# Patient Record
Sex: Female | Born: 1955 | Race: White | Hispanic: No | Marital: Married | Smoking: Never smoker
Health system: Southern US, Community
[De-identification: ages and names within clinical notes are randomized; demographics above are authoritative.]

## PROBLEM LIST (undated history)

## (undated) HISTORY — PX: ABDOMINAL HYSTERECTOMY: SHX81

---

## 2005-06-10 ENCOUNTER — Encounter: Admission: RE | Admit: 2005-06-10 | Discharge: 2005-06-10 | Payer: Self-pay | Admitting: Preventative Medicine

## 2006-02-07 ENCOUNTER — Emergency Department (HOSPITAL_COMMUNITY): Admission: EM | Admit: 2006-02-07 | Discharge: 2006-02-07 | Payer: Self-pay | Admitting: Emergency Medicine

## 2009-03-29 ENCOUNTER — Ambulatory Visit (HOSPITAL_COMMUNITY): Admission: RE | Admit: 2009-03-29 | Discharge: 2009-03-29 | Payer: Self-pay | Admitting: Obstetrics and Gynecology

## 2009-03-29 ENCOUNTER — Encounter: Payer: Self-pay | Admitting: Gastroenterology

## 2010-07-03 ENCOUNTER — Encounter: Admission: RE | Admit: 2010-07-03 | Discharge: 2010-07-03 | Payer: Self-pay | Admitting: Orthopedic Surgery

## 2015-09-28 ENCOUNTER — Other Ambulatory Visit: Payer: Self-pay | Admitting: *Deleted

## 2015-09-28 ENCOUNTER — Ambulatory Visit (INDEPENDENT_AMBULATORY_CARE_PROVIDER_SITE_OTHER): Payer: 59 | Admitting: Internal Medicine

## 2015-09-28 ENCOUNTER — Encounter: Payer: Self-pay | Admitting: Internal Medicine

## 2015-09-28 VITALS — BP 114/62 | HR 87 | Temp 98.2°F | Resp 12 | Wt 120.8 lb

## 2015-09-28 DIAGNOSIS — E039 Hypothyroidism, unspecified: Secondary | ICD-10-CM

## 2015-09-28 MED ORDER — NONFORMULARY OR COMPOUNDED ITEM: Status: DC

## 2015-09-28 NOTE — Patient Instructions (Addendum)
Please stop Biotin now and go for labs on Friday.  Please decrease the Thyroid USP to alternating 120 mg with 75 mg every other day.  Please try to join MyChart for easier communication.  Please come back for a follow-up appointment in 4 months.

## 2015-09-28 NOTE — Progress Notes (Addendum)
Patient ID: Claudia Owens, female   DOB: June 10, 1956, 60 y.o.   MRN: [id number]   HPI  Claudia Owens is a 60 y.o.-year-old female, referred by , Julio Sicks, NP , for management of uncontrolled hypothyroidism.  Pt. has been dx with hypothyroidism in 2012; she was on LT4 >> started to not feel well on this anymore (hair falling, irritability), added Cytomel >> did not like this >> now on Thyroid USP 75 >> 120 mg capsule (prev. Rx'ed by Dr Dorothyann Peng). This is a thyroid extract, containing both T3 and T4, dose equivalent to ~200 g of LT4.  She takes thyroid hh: - fasting - with water - separated by >30 min from b'fast  - no calcium, iron, PPIs - + B vitamins in am  I reviewed pt's thyroid tests - per records from [facility]: 06/23/2015: TSH 0.017, total T4 5.5 (4.5-12), free T4 0.84 (0.8-1.8), vitamin D 28 03/29/2015: Thyroglobulin antibodies <1 (0-0.9), TPO antibodies 8 (0-34) 03/08/2015: TSH 0.092, free T4 (direct) 0.78 (0.82-1.77), free T3 3.2 (2.0-4.4), B12 406, vitamin D 29.7 09/07/2014: TSH 0.049,  Direct free T4 0.99 (0.82-1.77), free T3 3.6 (2.0-4.4) 09/22/2009: TSH 1.67, free T4 (direct) 1.15 (0.82-1.77), vitamin D 30  Pt describes: - + weight gain - + palpitations - no fatigue - no cold intolerance - no depression - no constipation - no dry skin - no hair loss  She is on Hair Skin and Nails - takes this in am (took it this am)  Pt denies feeling nodules in neck, hoarseness, dysphagia/odynophagia, SOB with lying down.  She has + FH of thyroid disorders in: sister (hypothyroidism). No FH of thyroid cancer.  No h/o radiation tx to head or neck. No recent use of iodine supplements.  I reviewed her chart and she also has a history of vit D def.  She is on Estrogen pellets, and Pg at night. Exercises by running/walking 5x a week.  ROS: Constitutional: + weight gain, + fatigue, no subjective hyperthermia/hypothermia Eyes: no blurry vision, no xerophthalmia ENT: no sore  throat, no nodules palpated in throat, no dysphagia/odynophagia, no hoarseness Cardiovascular: no CP/SOB/+ palpitations/no leg swelling Respiratory: no cough/SOB Gastrointestinal: no N/V/D/C Musculoskeletal: no muscle/joint aches Skin: no rashes Neurological: no tremors/numbness/tingling/dizziness Psychiatric: no depression/anxiety  PMH: - see HPI - + basal cell CA  History reviewed. No pertinent past surgical history.   Social History   Social History  . Marital Status: Married    Spouse Name: N/A  . Number of Children: 4   Occupational History  . homemaker   Social History Main Topics  . Smoking status: Never Smoker   . Smokeless tobacco: Not on file  . Alcohol Use: 0.0 oz/week    0 Standard drinks or equivalent per week  . Drug Use: No   Current Outpatient Rx  Name  Route  Sig  Dispense  Refill  . traMADol (ULTRAM) 50 MG tablet   Oral   Take by mouth.          Allergies  Allergen Reactions  . Latex Rash    Family History  Problem Relation Age of Onset  . Hyperlipidemia Mother   . Hypertension Mother   . Cancer Father   . Thyroid disease Sister   . Heart disease Brother   . Cancer Brother    PE: BP 114/62 mmHg  Pulse 87  Temp(Src) 98.2 F (36.8 C) (Oral)  Resp 12  Wt 120 lb 12.8 oz (54.795 kg)  SpO2 96%  There is no height on file to calculate BMI.  Wt Readings from Last 3 Encounters:  09/28/15 120 lb 12.8 oz (54.795 kg)   Constitutional: overweight, in NAD, looking younger than stated age Eyes: PERRLA, EOMI, no exophthalmos, mild stare ENT: moist mucous membranes, no thyromegaly, no cervical lymphadenopathy Cardiovascular: RRR, No MRG Respiratory: CTA B Gastrointestinal: abdomen soft, NT, ND, BS+ Musculoskeletal: no deformities, strength intact in all 4 Skin: moist, warm, no rashes Neurological: no tremor with outstretched hands, DTR normal in all 4  ASSESSMENT: 1. Hypothyroidism - TPO Abs and ATA negative >> no sign of Hashimoto's  thyroiditis  PLAN:  1. Patient with long-standing hypothyroidism, with long-time suppression of her TSH, on a large dose of compounded desiccated thyroid formulation - [facility] [place] (the dosing is very similar to Armour thyroid, the equivalent of 200 g levothyroxine daily). I am not sure how to dose adjustments were done in the past, but reviewing her chart her TSH has been very suppressed at least for the last year. She appears euthyroid, but maybe a little restless and she does complain of palpitations and fatigue. She does not appear to have a goiter, thyroid nodules, or neck compression symptoms. - we discussed at length about the effects of long-term thyroid suppression on her organism including tachycardia/arrhythmia/palpitations, but also increased fatigue, increased metabolism of other medicines (she already noticed this with her estrogen replacement), increased risk of clots including stroke and heart attacks, increased risk of diabetes and hypertension, and also osteoporosis. She is in agreement of trying to bring her TSH to normal, but will try to do this slowly, to allow her body to adjust this. - We discussed about correct intake of thyroid hormones, fasting, with water, separated by at least 30 minutes from breakfast, and separated by more than 4 hours from calcium, iron, multivitamins, acid reflux medications (PPIs). She is taking it correctly. - I would have liked to check her thyroid tests today, however, she took biotin this morning. I advised her to have these labs drawn in 2 days. She will be out of town for a few days after this, therefore, for now, I advised her to decrease the dose to 120 mg alternating with 75 mg of compounded thyroid formulation (since she has this other dose at home) every other day. After I get the results back in 2 days, I will contact her to adjust the dose further. - We will check the TSH, free T4, free T3 in 2 days -  If these are abnormal, she will need to return in 5-6 weeks for repeat labs - If these are normal, I will see her back in 4 months  Component     Latest Ref Rng 09/30/2015  TSH     0.450 - 4.500 uIU/mL 0.021 (L)  Free T4     0.82 - 1.77 ng/dL 5.28  T3, Free     2.0 - 4.4 pg/mL 5.9 (H)   Will decrease dose to 90 mg Thyroid USP daily and have her come back for labs in 5-6 weeks.

## 2015-10-01 LAB — T4, FREE: Free T4: 0.96 ng/dL (ref 0.82–1.77)

## 2015-10-01 LAB — T3, FREE: T3 FREE: 5.9 pg/mL — AB (ref 2.0–4.4)

## 2015-10-01 LAB — TSH: TSH: 0.021 u[IU]/mL — ABNORMAL LOW (ref 0.450–4.500)

## 2015-10-03 ENCOUNTER — Ambulatory Visit: Payer: Self-pay | Admitting: Internal Medicine

## 2015-10-04 ENCOUNTER — Encounter: Payer: Self-pay | Admitting: Internal Medicine

## 2015-10-04 MED ORDER — NONFORMULARY OR COMPOUNDED ITEM: Status: DC

## 2015-10-04 NOTE — Addendum Note (Signed)
Addended by: Carlus Pavlov on: 10/04/2015 12:55 PM   Modules accepted: Orders

## 2015-10-31 ENCOUNTER — Telehealth: Payer: Self-pay | Admitting: Internal Medicine

## 2015-10-31 NOTE — Telephone Encounter (Signed)
Returned pt's call. Pt is to have labs done in 5-6 weeks from her last lab appt. Pt to call back and make appt or let us know if she will go to [facility] in [place]. Pt will call with fax # if she decides to go there.

## 2015-10-31 NOTE — Telephone Encounter (Signed)
Pt said she got a message about scheduling appointments and isn't sure when or what needed to be scheduled and wanted to double check with you to be sure.

## 2015-11-14 ENCOUNTER — Telehealth: Payer: Self-pay | Admitting: Internal Medicine

## 2015-11-14 NOTE — Telephone Encounter (Signed)
Pt called back and said she does want to go ahead today and do the labs at [facility] her and Carollee HerterShannon discussed earlier last week and to go ahead and send the orders ASAP Fax # [phone]

## 2015-11-14 NOTE — Telephone Encounter (Signed)
Order faxed to [facility] as instructed by patient.

## 2015-11-15 ENCOUNTER — Other Ambulatory Visit: Payer: Self-pay | Admitting: Internal Medicine

## 2015-11-16 LAB — TSH: TSH: 0.678 u[IU]/mL (ref 0.450–4.500)

## 2015-11-16 LAB — T4, FREE: Free T4: 0.84 ng/dL (ref 0.82–1.77)

## 2015-11-16 LAB — T3, FREE: T3, Free: 3.1 pg/mL (ref 2.0–4.4)

## 2015-12-26 ENCOUNTER — Telehealth: Payer: Self-pay | Admitting: Internal Medicine

## 2015-12-26 MED ORDER — NONFORMULARY OR COMPOUNDED ITEM: Status: DC

## 2015-12-26 NOTE — Telephone Encounter (Signed)
Patient need a refill of medication Thyroid UPS 90 mg send to  [facility] - [place],  - [address] [phone] (Phone) [phone] (Fax)

## 2015-12-27 ENCOUNTER — Telehealth: Payer: Self-pay | Admitting: Internal Medicine

## 2015-12-27 NOTE — Telephone Encounter (Signed)
Refill sent this morning 

## 2015-12-27 NOTE — Telephone Encounter (Signed)
Pt states the pharmacy has not received the thyroid rx please resend

## 2016-01-26 ENCOUNTER — Ambulatory Visit: Payer: Self-pay | Admitting: Internal Medicine

## 2016-04-05 ENCOUNTER — Telehealth: Payer: Self-pay | Admitting: Internal Medicine

## 2016-04-05 ENCOUNTER — Other Ambulatory Visit: Payer: Self-pay

## 2016-04-05 DIAGNOSIS — E039 Hypothyroidism, unspecified: Secondary | ICD-10-CM

## 2016-04-05 NOTE — Telephone Encounter (Signed)
Yes, no problem: Can you please order a TSH, free T4, free T3?

## 2016-04-05 NOTE — Telephone Encounter (Signed)
Called patient back, she would like her lab orders sent to [facility] in [place], I advised I would fax over the orders and she could go get them drawn. No other questions were asked.

## 2016-04-05 NOTE — Telephone Encounter (Signed)
Patient stated that she has been feeling bad l and would like to know if she can get labs before she see you, in September? send to [facility] in [place] phone # [phone].

## 2016-04-09 ENCOUNTER — Ambulatory Visit: Payer: Self-pay | Admitting: Internal Medicine

## 2016-04-25 NOTE — Telephone Encounter (Signed)
[facility] called, stated  Need a order for labs fax [phone]

## 2016-04-25 NOTE — Telephone Encounter (Signed)
Faxed over to appropriate number for [facility] with lab orders.

## 2016-04-27 ENCOUNTER — Other Ambulatory Visit: Payer: Self-pay | Admitting: Internal Medicine

## 2016-04-28 LAB — T3: T3, Total: 138 ng/dL (ref 71–180)

## 2016-04-28 LAB — T4, FREE: Free T4: 1.03 ng/dL (ref 0.82–1.77)

## 2016-04-28 LAB — TSH: TSH: 2.23 u[IU]/mL (ref 0.450–4.500)

## 2016-05-01 ENCOUNTER — Ambulatory Visit (INDEPENDENT_AMBULATORY_CARE_PROVIDER_SITE_OTHER): Payer: 59 | Admitting: Internal Medicine

## 2016-05-01 ENCOUNTER — Other Ambulatory Visit: Payer: Self-pay

## 2016-05-01 ENCOUNTER — Encounter: Payer: Self-pay | Admitting: Internal Medicine

## 2016-05-01 VITALS — BP 104/72 | HR 76 | Wt 118.0 lb

## 2016-05-01 DIAGNOSIS — E039 Hypothyroidism, unspecified: Secondary | ICD-10-CM | POA: Diagnosis not present

## 2016-05-01 MED ORDER — NONFORMULARY OR COMPOUNDED ITEM: 3 refills | Status: DC

## 2016-05-01 NOTE — Patient Instructions (Addendum)
Please return in 1 year, but in 6 months for labs.  Please continue: - Thyroid USP to 90 mg daily  Take the thyroid hormone every day, with water, at least 30 minutes before breakfast, separated by at least 4 hours from: - acid reflux medications - calcium - iron - multivitamins

## 2016-05-01 NOTE — Progress Notes (Signed)
Patient ID: Claudia Owens, female   DOB: 1956/06/24, 60 y.o.   MRN: [id number]   HPI  Claudia Owens is a 60 y.o.-year-old female, retirning for f/u for uncontrolled hypothyroidism. Last visit 7 mo ago.  Reviewed hx: Pt. has been dx with hypothyroidism in 2012; she was on LT4 >> started to not feel well on this anymore (hair falling, irritability), added Cytomel >> did not like this >> now on Thyroid USP 75 >> 120 mg capsule (prev. Rx'ed by Dr Dorothyann Pengobyn Sanders). This is a thyroid extract, containing both T3 and T4, dose equivalent to ~200 g of LT4.  We have decreased the dose of Thyroid USP to 90 mg daily >> TFTs normalized.  She takes thyroid hh: - fasting - with water - separated by >30 min from b'fast  - no calcium, iron, PPIs - + B vitamins in am  Latest TFTs: Lab Results  Component Value Date   TSH 2.230 04/27/2016   TSH 0.678 11/15/2015   TSH 0.021 (L) 09/30/2015   FREET4 1.03 04/27/2016   FREET4 0.84 11/15/2015   FREET4 0.96 09/30/2015   I reviewed pt's thyroid tests - per records from [facility]: 06/23/2015: TSH 0.017, total T4 5.5 (4.5-12), free T4 0.84 (0.8-1.8), vitamin D 28 03/29/2015: Thyroglobulin antibodies <1 (0-0.9), TPO antibodies 8 (0-34) 03/08/2015: TSH 0.092, free T4 (direct) 0.78 (0.82-1.77), free T3 3.2 (2.0-4.4), B12 406, vitamin D 29.7 09/07/2014: TSH 0.049,  Direct free T4 0.99 (0.82-1.77), free T3 3.6 (2.0-4.4) 09/22/2009: TSH 1.67, free T4 (direct) 1.15 (0.82-1.77), vitamin D 30  Pt describes: - no weight gain - no palpitations - + fatigue - no cold intolerance - no depression - no constipation - no dry skin - no hair loss  She is on Hair Skin and Nails - takes this in am (took it this am)  Pt denies feeling nodules in neck, hoarseness, dysphagia/odynophagia, SOB with lying down.  She has + FH of thyroid disorders in: sister (hypothyroidism). No FH of thyroid cancer.  No h/o radiation tx to head or neck. No recent use of iodine supplements.  I  reviewed her chart and she also has a history of vit D def.  She is on HRT: Estrogen + Testosterone pellets, and Pg at night. Exercises by running/walking 5x a week.  ROS: Constitutional: no weight gain, + fatigue, no subjective hyperthermia/hypothermia Eyes: no blurry vision, no xerophthalmia ENT: no sore throat, no nodules palpated in throat, no dysphagia/odynophagia, no hoarseness Cardiovascular: no CP/SOB/palpitations/no leg swelling Respiratory: no cough/SOB Gastrointestinal: no N/V/D/C Musculoskeletal: no muscle/joint aches Skin: no rashes Neurological: no tremors/numbness/tingling/dizziness  I reviewed pt's medications, allergies, PMH, social hx, family hx, and changes were documented in the history of present illness. Otherwise, unchanged from my initial visit note.  PMH: - see HPI - + basal cell CA  No past surgical history on file.   Social History   Social History  . Marital Status: Married    Spouse Name: N/A  . Number of Children: 4   Occupational History  . homemaker   Social History Main Topics  . Smoking status: Never Smoker   . Smokeless tobacco: Not on file  . Alcohol Use: 0.0 oz/week    0 Standard drinks or equivalent per week  . Drug Use: No   Current Outpatient Prescriptions on File Prior to Visit  Medication Sig Dispense Refill  . NONFORMULARY OR COMPOUNDED ITEM Thyroid USP 90 mg daily - compounded. 45 each 2  . traMADol (ULTRAM) 50 MG  tablet Take by mouth.     No current facility-administered medications on file prior to visit.     Allergies  Allergen Reactions  . Amoxicillin Other (See Comments)    Intestinal Bleeding  . Propoxyphene Nausea And Vomiting  . Latex Rash    Family History  Problem Relation Age of Onset  . Hyperlipidemia Mother   . Hypertension Mother   . Cancer Father   . Thyroid disease Sister   . Heart disease Brother   . Cancer Brother    PE: BP 104/72 (BP Location: Left Arm, Patient Position: Sitting)   Pulse 76    Wt 118 lb (53.5 kg)   SpO2 97%   There is no height or weight on file to calculate BMI.  Wt Readings from Last 3 Encounters:  05/01/16 118 lb (53.5 kg)  09/28/15 120 lb 12.8 oz (54.8 kg)   Constitutional: overweight, in NAD, looking younger than stated age Eyes: PERRLA, EOMI, no exophthalmos, mild stare ENT: moist mucous membranes, no thyromegaly, + B cervical lymphadenopathy Cardiovascular: RRR, No MRG Respiratory: CTA B Gastrointestinal: abdomen soft, NT, ND, BS+ Musculoskeletal: no deformities, strength intact in all 4 Skin: moist, warm, no rashes Neurological: no tremor with outstretched hands, DTR normal in all 4  ASSESSMENT: 1. Hypothyroidism - TPO Abs and ATA negative >> no sign of Hashimoto's thyroiditis  PLAN:  1. Patient with long-standing hypothyroidism, with long-time suppression of her TSH, on a large dose of compounded desiccated thyroid formulation >> we decreased the dose of Thyroid USP >> TFTs normalized. Last tests were from 04/27/2016 >> nomal - she [facility] [place] >> will fax a new Rx. - We discussed about correct intake of thyroid hormones, fasting, with water, separated by at least 30 minutes from breakfast, and separated by more than 4 hours from calcium, iron, multivitamins, acid reflux medications (PPIs). She is taking it correctly. - will recheck her labs in 6 months - prefers to have these drawn locally - I will see her back in 1 year  No visits with results within 3 Day(s) from this visit.  Latest known visit with results is:  Orders Only on 04/27/2016  Component Date Value Ref Range Status  . Free T4 04/28/2016 1.03  0.82 - 1.77 ng/dL Final  . TSH 46/96/2952 2.230  0.450 - 4.500 uIU/mL Final  . T3, Total 04/28/2016 138  71 - 180 ng/dL Final   Normal TFTs. We can continue with the current dose of thyroid USP.  Carlus Pavlov, MD PhD [facility] Endocrinology

## 2017-02-06 ENCOUNTER — Telehealth: Payer: Self-pay | Admitting: Internal Medicine

## 2017-02-06 ENCOUNTER — Encounter: Payer: Self-pay | Admitting: Internal Medicine

## 2017-02-06 NOTE — Telephone Encounter (Signed)
attempted to contact pt, appt needs to be moved, no ans no vm set up, mailed letter

## 2017-03-19 ENCOUNTER — Ambulatory Visit: Payer: 59 | Admitting: Internal Medicine

## 2017-04-30 ENCOUNTER — Other Ambulatory Visit: Payer: Self-pay

## 2017-04-30 ENCOUNTER — Telehealth: Payer: Self-pay | Admitting: Internal Medicine

## 2017-04-30 MED ORDER — NONFORMULARY OR COMPOUNDED ITEM: 3 refills | Status: AC

## 2017-04-30 NOTE — Telephone Encounter (Signed)
Routing to you °

## 2017-04-30 NOTE — Telephone Encounter (Signed)
MEDICATION: NONFORMULARY OR COMPOUNDED ITEM  PHARMACY:  [facility] - [place] - [address] [place] [address] Phone: [phone] Fax: [phone]     IS THIS A 90 DAY SUPPLY : no  IS PATIENT OUT OF MEDICATION: no  IF NOT; HOW MUCH IS LEFT: 5  LAST APPOINTMENT DATE:05/01/16  NEXT APPOINTMENT DATE:05/24/17  OTHER COMMENTS:    **Let patient know to contact pharmacy at the end of the day to make sure medication is ready. **  ** Please notify patient to allow 48-72 hours to process**  **Encourage patient to contact the pharmacy for refills or they can request ref[facility] through [facility]**

## 2017-04-30 NOTE — Telephone Encounter (Signed)
This has been ordered 

## 2017-05-01 NOTE — Telephone Encounter (Signed)
Patient told by [facility] that the Nonformulary script is not available at this time.  Please call in other medication to replace it. Patient would like a call back to discuss other options.  Ty,  -LL

## 2017-05-02 ENCOUNTER — Ambulatory Visit: Payer: 59 | Admitting: Internal Medicine

## 2017-05-02 NOTE — Telephone Encounter (Signed)
Claudia FanningJulie I was unable to get in touch with this patient. I tried, but patient did not have a working Engineer, technical salesvoicemail.

## 2017-05-02 NOTE — Telephone Encounter (Signed)
Attempted to reach the patient. Patient was not available. 

## 2017-05-02 NOTE — Telephone Encounter (Signed)
See message to be advised.  

## 2017-05-02 NOTE — Telephone Encounter (Signed)
The options are: Armour Thyroid DAW 90 mg daily or NatureThroid DAW 97.5 mg daily. We can send whichever she prefers. The NatureThroid is a little more pure but usually harder to find and less likely to be covered by insurance.

## 2017-05-02 NOTE — Telephone Encounter (Signed)
Pharmacy called in reference to sending fax over for patient going back on t3 t4 combo. Please call pharmacy and advise.

## 2017-05-03 NOTE — Telephone Encounter (Signed)
Pharmacy called to check the status of the notes below. Call today to advise.

## 2017-05-03 NOTE — Telephone Encounter (Signed)
Patient called in reference to notes below. Patient stated she has not received any voicemail's or phone calls. Patient stated pharmacy needs to be called today in order for her to get her medication ASAP. Patient stated it is ok to use replacement. Patient stated pharmacy needs to be contacted today. Please call pharmacy and advise.

## 2017-05-03 NOTE — Telephone Encounter (Signed)
Routing to you °

## 2017-05-06 ENCOUNTER — Telehealth: Payer: Self-pay

## 2017-05-06 NOTE — Telephone Encounter (Signed)
Called [facility], they had a verbal order submitted. I called patient and advised of medication apologized for inconvenience that was caused.

## 2017-05-06 NOTE — Telephone Encounter (Signed)
Called [facility], they had a verbal order submitted. I called patient and advised of medication apologized for inconvenience that was caused.  

## 2017-05-07 ENCOUNTER — Other Ambulatory Visit: Payer: Self-pay

## 2017-05-24 ENCOUNTER — Ambulatory Visit: Payer: 59 | Admitting: Internal Medicine

## 2019-05-12 ENCOUNTER — Other Ambulatory Visit: Payer: Self-pay | Admitting: Adult Health Nurse Practitioner

## 2019-05-12 DIAGNOSIS — G4452 New daily persistent headache (NDPH): Secondary | ICD-10-CM

## 2019-06-02 ENCOUNTER — Other Ambulatory Visit: Payer: Self-pay

## 2019-06-02 ENCOUNTER — Ambulatory Visit
Admission: RE | Admit: 2019-06-02 | Discharge: 2019-06-02 | Disposition: A | Discharge status: Home / Self Care | Payer: 59 | Source: Ambulatory Visit | Attending: Adult Health Nurse Practitioner | Admitting: Adult Health Nurse Practitioner

## 2019-06-02 DIAGNOSIS — G4452 New daily persistent headache (NDPH): Secondary | ICD-10-CM

## 2019-06-02 MED ORDER — GADOBENATE DIMEGLUMINE 529 MG/ML IV SOLN
11.0000 mL | Freq: Once | INTRAVENOUS | Status: AC | PRN
  Administered 2019-06-02: 11 mL via INTRAVENOUS

## 2019-06-22 ENCOUNTER — Encounter: Payer: Self-pay | Admitting: Neurology

## 2019-06-22 ENCOUNTER — Encounter

## 2019-06-22 ENCOUNTER — Ambulatory Visit: Payer: 59 | Admitting: Neurology

## 2019-06-22 ENCOUNTER — Telehealth: Payer: Self-pay | Admitting: *Deleted

## 2019-06-22 NOTE — Telephone Encounter (Signed)
Pt called to cancel new patient appt same day.  She did not reschedule.

## 2020-06-07 IMAGING — MR MR CERVICAL SPINE WO/W CM
4 of 8 series · 22 of 48 positions shown · IV contrast (multihance)
Comparison: CT cervical spine from 8977.

CLINICAL DATA: Neck and chronic right shoulder pain.

EXAM:
MRI CERVICAL SPINE WITHOUT AND WITH CONTRAST
TECHNIQUE: Multiplanar and multiecho pulse sequences of the cervical spine, to
include the craniocervical junction and cervicothoracic junction,
were obtained without and with intravenous contrast.
CONTRAST:  11mL MULTIHANCE GADOBENATE DIMEGLUMINE 529 MG/ML IV SOLN

[Series 2: T1 · sagittal · 3.0mm · 0.41mm/px · 3 of 12 slices shown]
[im 1/12]
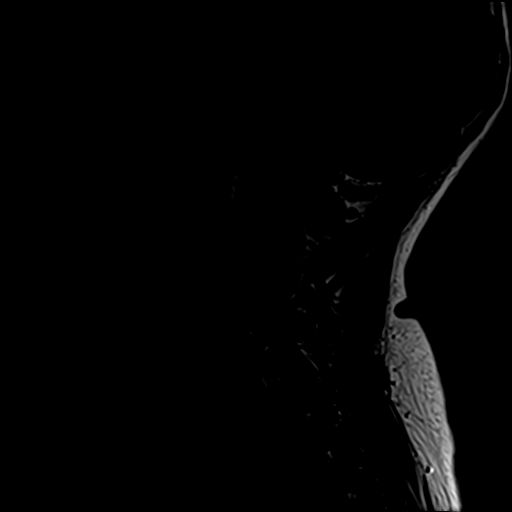
[im 8/12]
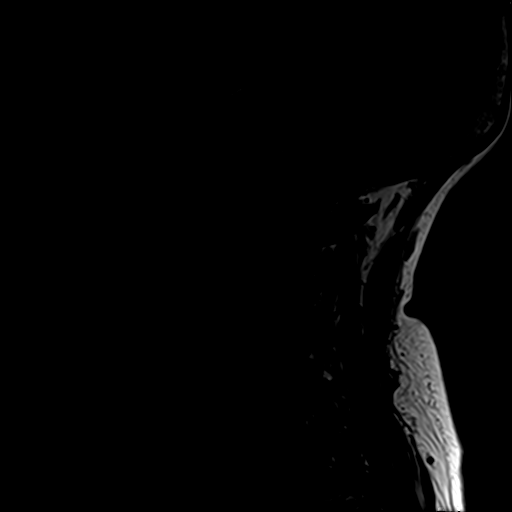
[im 12/12]
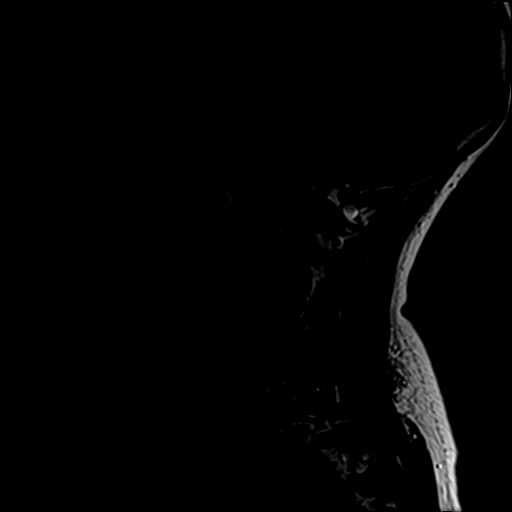

[Series 4: T2 · axial · 3.0mm · 0.39mm/px · z∈[-70,+10]mm · 8 of 23 slices shown (1 of 3)]
[im 1/23]
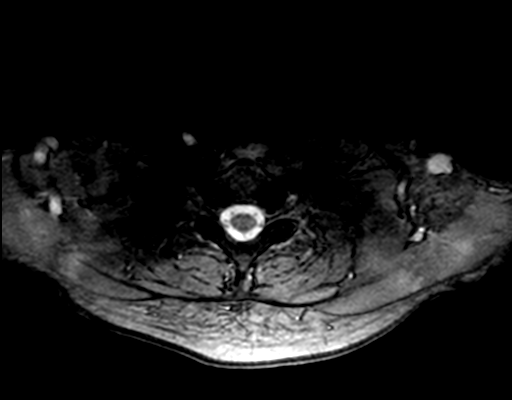
[im 4/23]
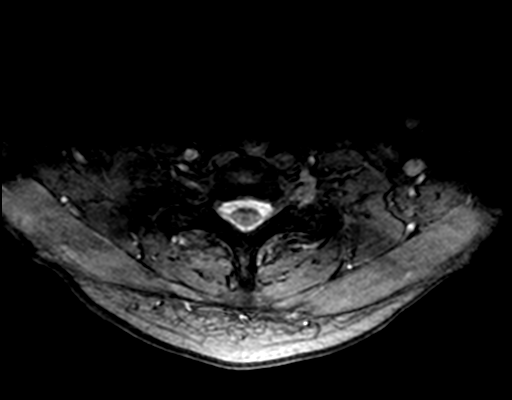
[im 7/23]
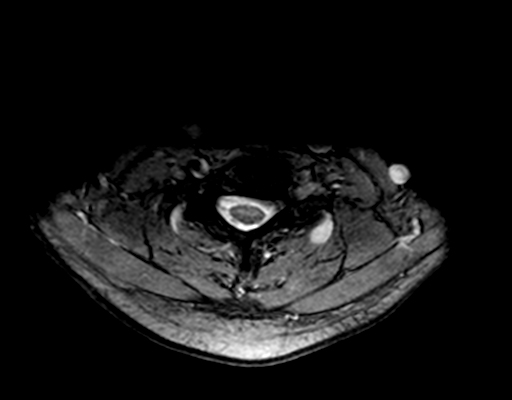
[im 10/23]
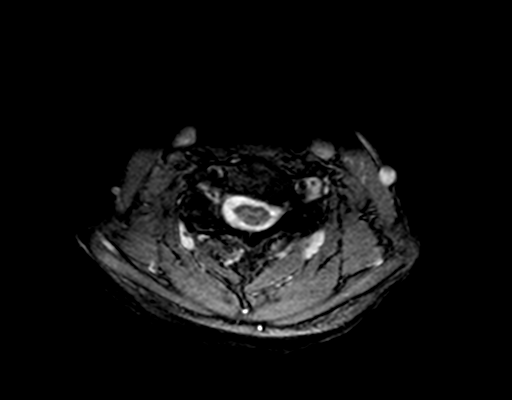
[im 13/23]
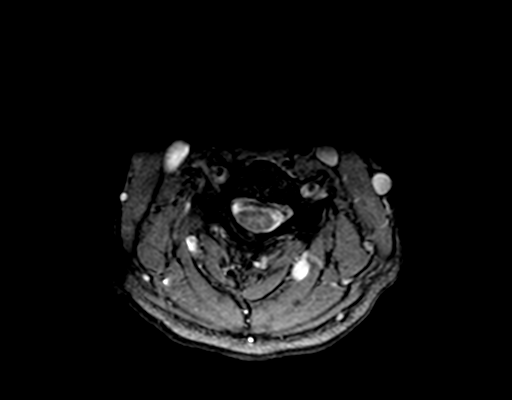
[im 16/23]
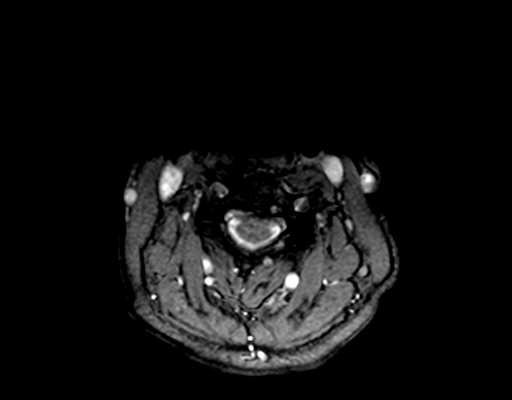
[im 19/23]
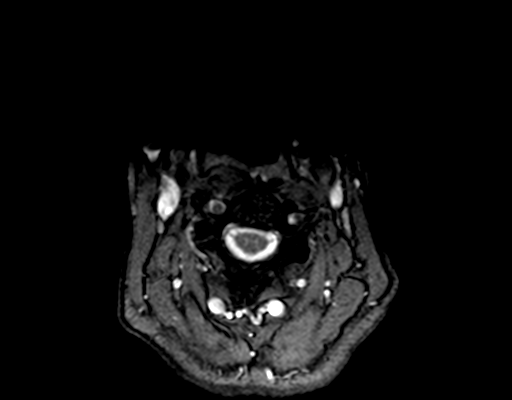
[im 23/23]
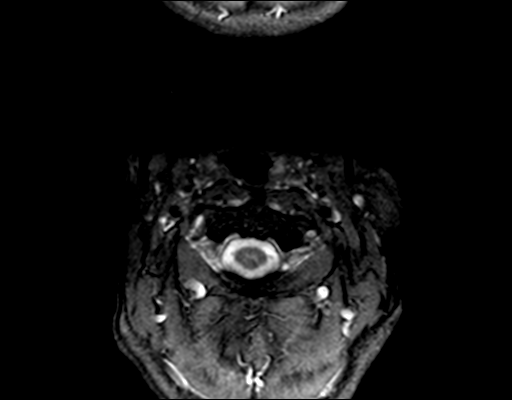

[Series 5: T2 · axial · 3.0mm · 0.39mm/px · z∈[-70,+9]mm · 8 of 23 slices shown (2 of 3)]
[im 1/23]
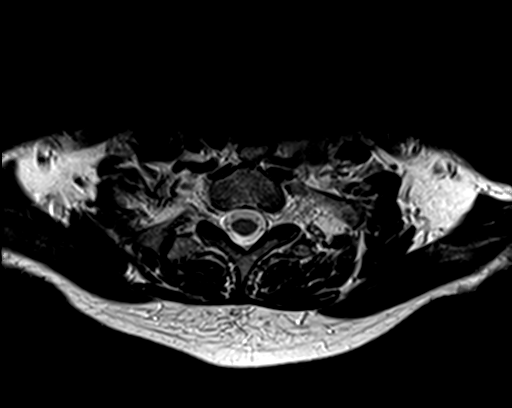
[im 4/23]
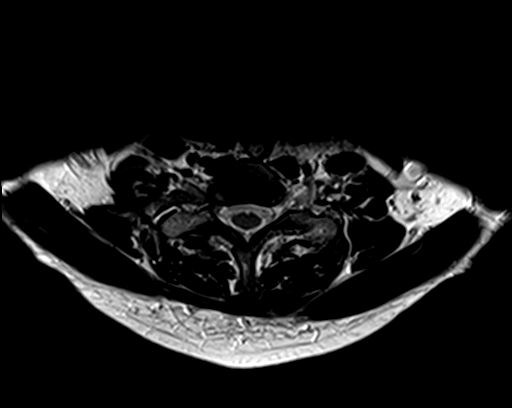
[im 7/23]
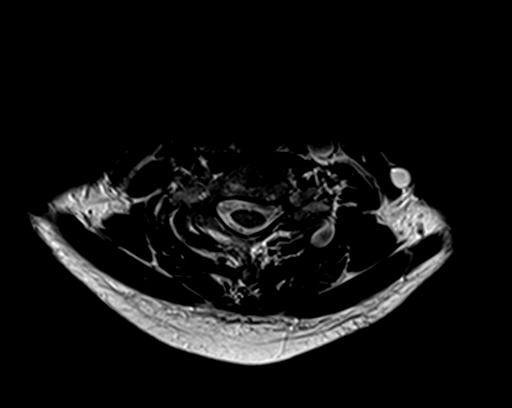
[im 10/23]
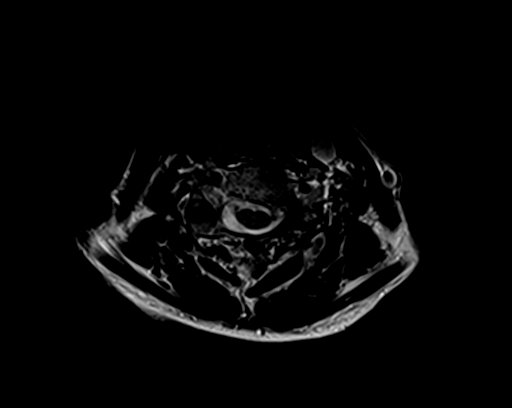
[im 13/23]
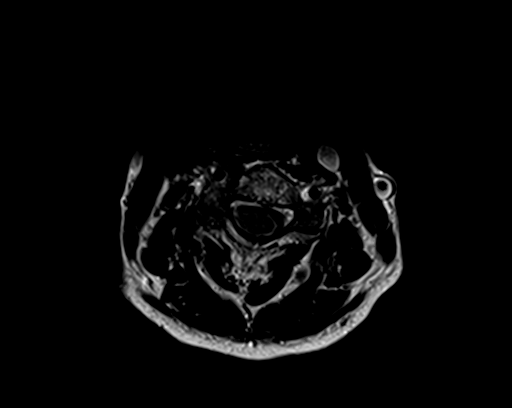
[im 16/23]
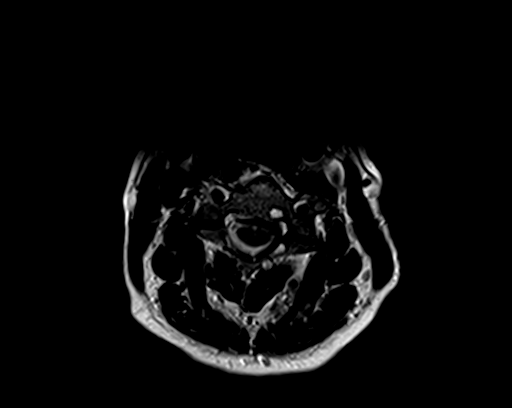
[im 19/23]
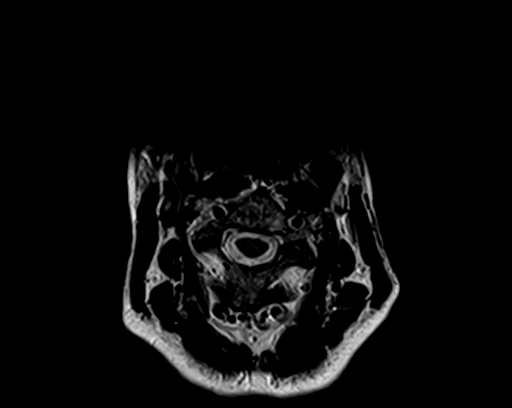
[im 23/23]
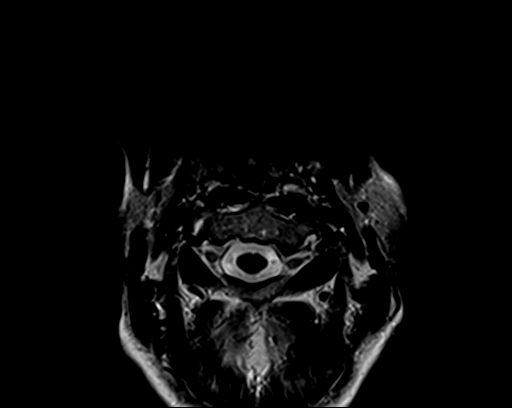

[Series 7: T2 · sagittal · 3.0mm · 0.41mm/px · 3 of 12 slices shown (3 of 3)]
[im 1/12]
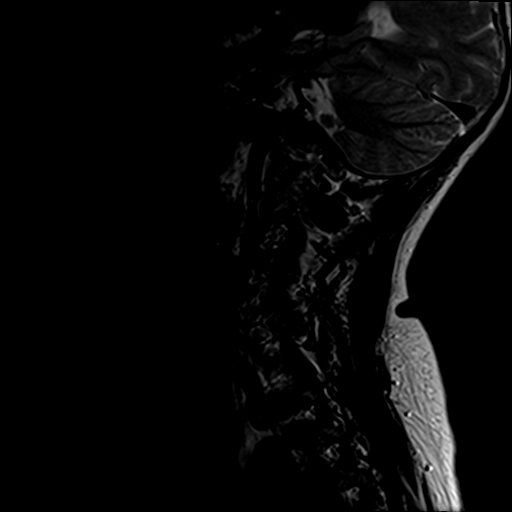
[im 8/12]
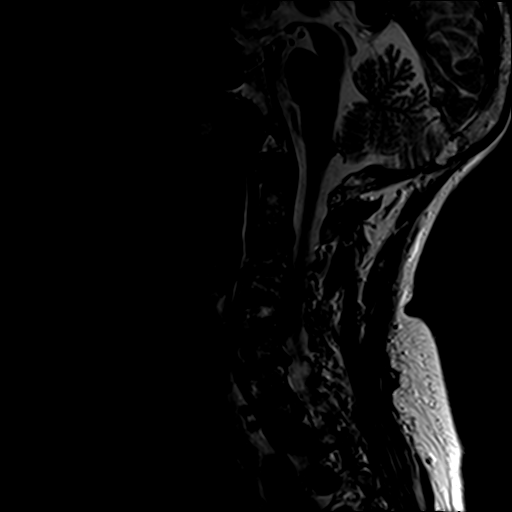
[im 12/12]
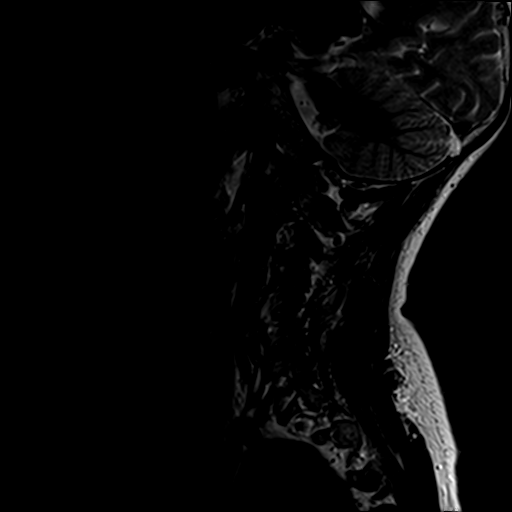

[22 of 48 positions shown; findings below may reference images not displayed]

FINDINGS: Alignment: Normal overall alignment. Moderate degenerative cervical
spondylosis with minimal multilevel degenerative subluxations.

Vertebrae: Normal marrow signal except for endplate reactive
changes. No bone lesions or fractures.

Cord: Normal cord signal intensity.  No cord lesions or syrinx.

Posterior Fossa, vertebral arteries, paraspinal tissues: No
significant findings.

Disc levels:

C2-3: No significant findings.

C3-4: Degenerative disc disease with a mild bulging annulus and mild
osteophytic ridging. There is flattening of the ventral thecal sac
and mild narrowing the ventral CSF space. Uncinate spurring changes
bilaterally with mild bilateral foraminal narrowing.

C4-5: Degenerative disc disease with a bulging annulus, osteophytic
ridging and uncinate spurring. There is mass effect on the ventral
thecal sac, asymmetric right and narrowing of the ventral CSF space.
There is also mild/moderate bilateral foraminal stenosis.

C5-6: Degenerative disc disease and facet disease with a bulging
annulus, osteophytic ridging and uncinate spurring. There is
flattening of the ventral thecal sac and narrowing the ventral CSF
space and mild/moderate bilateral foraminal stenosis.

C6-7: Bilateral disc osteophyte complexes with bilateral foraminal
stenosis, left greater than right. No significant spinal stenosis.

C7-T1: No significant findings.
IMPRESSION: Degenerative cervical spondylosis with multilevel disc disease and
facet disease.

Multilevel, multifactorial spinal and foraminal stenosis as detailed
above at the individual levels.

## 2020-09-01 ENCOUNTER — Encounter (HOSPITAL_COMMUNITY): Payer: Self-pay | Admitting: Emergency Medicine

## 2020-09-01 ENCOUNTER — Other Ambulatory Visit: Payer: Self-pay

## 2020-09-01 ENCOUNTER — Emergency Department (HOSPITAL_COMMUNITY)
Admission: EM | Admit: 2020-09-01 | Discharge: 2020-09-01 | Disposition: A | Discharge status: Home / Self Care | Payer: Medicare Other | Attending: Emergency Medicine | Admitting: Emergency Medicine

## 2020-09-01 DIAGNOSIS — E039 Hypothyroidism, unspecified: Secondary | ICD-10-CM | POA: Diagnosis not present

## 2020-09-01 DIAGNOSIS — Z91040 Latex allergy status: Secondary | ICD-10-CM | POA: Diagnosis not present

## 2020-09-01 DIAGNOSIS — R339 Retention of urine, unspecified: Secondary | ICD-10-CM | POA: Insufficient documentation

## 2020-09-01 LAB — URINALYSIS, ROUTINE W REFLEX MICROSCOPIC
Bacteria, UA: NONE SEEN
Bilirubin Urine: NEGATIVE
Glucose, UA: NEGATIVE mg/dL
Hgb urine dipstick: NEGATIVE
Ketones, ur: NEGATIVE mg/dL
Leukocytes,Ua: NEGATIVE
Nitrite: POSITIVE — AB
Protein, ur: NEGATIVE mg/dL
Specific Gravity, Urine: 1.008 (ref 1.005–1.030)
pH: 6 (ref 5.0–8.0)

## 2020-09-01 NOTE — Discharge Instructions (Signed)
You will need to leave the Foley in place for at least 5 to 7 days.  Please schedule follow-up at one of the listed urology offices to be further evaluated for removal.

## 2020-09-01 NOTE — ED Provider Notes (Signed)
[facility] EMERGENCY DEPARTMENT Provider Note   CSN: [id number] Arrival date & time: 09/01/20  0547     History Chief Complaint  Patient presents with  . Urinary Retention    Claudia Owens is a 65 y.o. female.  Patient presents to the emergency department for evaluation of urinary retention.  Patient had hysterectomy at [facility] 12 days ago.  She did have a Foley catheter in place after surgery.  Patient reports that catheter was removed recently and she had been urinating without difficulty until last night around 10:30 PM.  She has not been able to urinate since then and has been experiencing progressively worsening pain in the area of her bladder.        History reviewed. No pertinent past medical history.  Patient Active Problem List   Diagnosis Date Noted  . Hypothyroidism 09/28/2015    Past Surgical History:  Procedure Laterality Date  . ABDOMINAL HYSTERECTOMY       OB History   No obstetric history on file.     Family History  Problem Relation Age of Onset  . Hyperlipidemia Mother   . Hypertension Mother   . Cancer Father   . Thyroid disease Sister   . Heart disease Brother   . Cancer Brother     Social History   Tobacco Use  . Smoking status: Never Smoker  . Smokeless tobacco: Never Used  Substance Use Topics  . Alcohol use: Yes    Alcohol/week: 0.0 standard drinks  . Drug use: No    Home Medications Prior to Admission medications   Medication Sig Start Date End Date Taking? Authorizing Provider  celecoxib (CELEBREX) 200 MG capsule Take by mouth. 02/21/16   [provider]  cyclobenzaprine (FLEXERIL) 10 MG tablet Take by mouth.    [provider]  HYDROcodone-ibuprofen (VICOPROFEN) 7.5-200 MG tablet Take by mouth. 02/21/16   [provider]  NONFORMULARY OR COMPOUNDED ITEM Thyroid USP 90 mg daily - compounded. 04/30/17   Carlus Pavlov, MD  traMADol Janean Sark) 50 MG tablet Take by mouth. 08/26/15   [provider]    Allergies    Amoxicillin, Propoxyphene, and Latex  Review of Systems   Review of Systems  Genitourinary: Positive for decreased urine volume.  All other systems reviewed and are negative.   Physical Exam Updated Vital Signs BP 120/90   Pulse 85   Temp 98.3 F (36.8 C) (Oral)   Resp (!) 22   Ht 5' (1.524 m)   Wt 54 kg   SpO2 94%   BMI 23.24 kg/m   Physical Exam Vitals and nursing note reviewed.  Constitutional:      General: She is not in acute distress.    Appearance: Normal appearance. She is well-developed and well-nourished.  HENT:     Head: Normocephalic and atraumatic.     Right Ear: Hearing normal.     Left Ear: Hearing normal.     Nose: Nose normal.     Mouth/Throat:     Mouth: Oropharynx is clear and moist and mucous membranes are normal.  Eyes:     Extraocular Movements: EOM normal.     Conjunctiva/sclera: Conjunctivae normal.     Pupils: Pupils are equal, round, and reactive to light.  Cardiovascular:     Rate and Rhythm: Regular rhythm.     Heart sounds: S1 normal and S2 normal. No murmur heard. No friction rub. No gallop.   Pulmonary:     Effort: Pulmonary effort  is normal. No respiratory distress.     Breath sounds: Normal breath sounds.  Chest:     Chest wall: No tenderness.  Abdominal:     General: Bowel sounds are normal.     Palpations: Abdomen is soft. There is no hepatosplenomegaly.     Tenderness: There is abdominal tenderness in the suprapubic area. There is no guarding or rebound. Negative signs include Murphy's sign and McBurney's sign.     Hernia: No hernia is present.     Comments: Distended bladder  Musculoskeletal:        General: Normal range of motion.     Cervical back: Normal range of motion and neck supple.  Skin:    General: Skin is warm, dry and intact.     Findings: No rash.     Nails: There is no cyanosis.  Neurological:     Mental Status: She is alert and oriented to person, place, and time.      GCS: GCS eye subscore is 4. GCS verbal subscore is 5. GCS motor subscore is 6.     Cranial Nerves: No cranial nerve deficit.     Sensory: No sensory deficit.     Coordination: Coordination normal.     Deep Tendon Reflexes: Strength normal.  Psychiatric:        Mood and Affect: Mood and affect normal.        Speech: Speech normal.        Behavior: Behavior normal.        Thought Content: Thought content normal.     ED Results / Procedures / Treatments   Labs (all labs ordered are listed, but only abnormal results are displayed) Labs Reviewed  URINALYSIS, ROUTINE W REFLEX MICROSCOPIC - Abnormal; Notable for the following components:      Result Value   Color, Urine AMBER (*)    Nitrite POSITIVE (*)    All other components within normal limits    EKG None  Radiology No results found.  Procedures Procedures (including critical care time)  Medications Ordered in ED Medications - No data to display  ED Course  I have reviewed the triage vital signs and the nursing notes.  Pertinent labs & imaging results that were available during my care of the patient were reviewed by me and considered in my medical decision making (see chart for details).    MDM Rules/Calculators/A&P                          Patient presents with abdominal pain secondary to urinary retention.  Patient had recent hysterectomy and did have a Foley catheter in place after the surgery.  She has been able to urinate since the catheter was removed until last night, now unable to pass any urine.  Patient has been healing well after the surgery.  Surgical site is pristine, no sign of infection or breakdown.  She saw her OB/GYN several days ago and was released from a postsurgical standpoint.  Repeat examination after Foley placement reveals a benign, nontender abdominal exam.  A Foley catheter was placed, patient put out more than a liter of urine.  She has had resolution of her pain.  Urinalysis does not  suggest infection.  Will leave Foley catheter in place, have follow-up with urology for removal. Final Clinical Impression(s) / ED Diagnoses Final diagnoses:  Urinary retention    Rx / DC Orders ED Discharge Orders    None  Orpah Greek, MD 09/01/20 [phone]

## 2020-09-01 NOTE — ED Triage Notes (Signed)
Pt states she has been unable to urinate since 2230 last night. Pt with hysterectomy 12 days ago. Pt tearful in triage. Bladder scan performed @ bedside showed urine in bladder.
